# Patient Record
Sex: Female | Born: 1965
Health system: Southern US, Community
[De-identification: ages and names within clinical notes are randomized; demographics above are authoritative.]

---

## 2016-04-20 DIAGNOSIS — I10 Essential (primary) hypertension: Secondary | ICD-10-CM | POA: Diagnosis not present

## 2016-04-20 DIAGNOSIS — D649 Anemia, unspecified: Secondary | ICD-10-CM | POA: Diagnosis not present

## 2016-04-20 DIAGNOSIS — R7309 Other abnormal glucose: Secondary | ICD-10-CM | POA: Diagnosis not present

## 2016-08-25 DIAGNOSIS — R7303 Prediabetes: Secondary | ICD-10-CM | POA: Diagnosis not present

## 2016-08-25 DIAGNOSIS — R7309 Other abnormal glucose: Secondary | ICD-10-CM | POA: Diagnosis not present

## 2016-08-25 DIAGNOSIS — I10 Essential (primary) hypertension: Secondary | ICD-10-CM | POA: Diagnosis not present

## 2016-08-25 DIAGNOSIS — F4321 Adjustment disorder with depressed mood: Secondary | ICD-10-CM | POA: Diagnosis not present

## 2016-12-29 DIAGNOSIS — R7303 Prediabetes: Secondary | ICD-10-CM | POA: Diagnosis not present

## 2016-12-29 DIAGNOSIS — F4321 Adjustment disorder with depressed mood: Secondary | ICD-10-CM | POA: Diagnosis not present

## 2016-12-29 DIAGNOSIS — G4700 Insomnia, unspecified: Secondary | ICD-10-CM | POA: Diagnosis not present

## 2016-12-29 DIAGNOSIS — F5101 Primary insomnia: Secondary | ICD-10-CM | POA: Diagnosis not present

## 2016-12-29 DIAGNOSIS — I10 Essential (primary) hypertension: Secondary | ICD-10-CM | POA: Diagnosis not present

## 2016-12-29 DIAGNOSIS — R7309 Other abnormal glucose: Secondary | ICD-10-CM | POA: Diagnosis not present

## 2016-12-29 DIAGNOSIS — D649 Anemia, unspecified: Secondary | ICD-10-CM | POA: Diagnosis not present

## 2017-01-04 DIAGNOSIS — R799 Abnormal finding of blood chemistry, unspecified: Secondary | ICD-10-CM | POA: Diagnosis not present

## 2017-01-12 DIAGNOSIS — I10 Essential (primary) hypertension: Secondary | ICD-10-CM | POA: Diagnosis not present

## 2017-01-12 DIAGNOSIS — D649 Anemia, unspecified: Secondary | ICD-10-CM | POA: Diagnosis not present

## 2017-01-12 DIAGNOSIS — R7309 Other abnormal glucose: Secondary | ICD-10-CM | POA: Diagnosis not present

## 2017-01-12 DIAGNOSIS — F5101 Primary insomnia: Secondary | ICD-10-CM | POA: Diagnosis not present

## 2017-01-21 DIAGNOSIS — N946 Dysmenorrhea, unspecified: Secondary | ICD-10-CM | POA: Diagnosis not present

## 2017-01-21 DIAGNOSIS — I10 Essential (primary) hypertension: Secondary | ICD-10-CM | POA: Diagnosis not present

## 2017-01-21 DIAGNOSIS — D509 Iron deficiency anemia, unspecified: Secondary | ICD-10-CM | POA: Diagnosis not present

## 2017-01-21 DIAGNOSIS — E559 Vitamin D deficiency, unspecified: Secondary | ICD-10-CM | POA: Diagnosis not present

## 2017-06-27 DIAGNOSIS — Z6831 Body mass index (BMI) 31.0-31.9, adult: Secondary | ICD-10-CM | POA: Diagnosis not present

## 2017-06-27 DIAGNOSIS — I10 Essential (primary) hypertension: Secondary | ICD-10-CM | POA: Diagnosis not present

## 2017-06-27 DIAGNOSIS — R7309 Other abnormal glucose: Secondary | ICD-10-CM | POA: Diagnosis not present

## 2017-06-27 DIAGNOSIS — G4700 Insomnia, unspecified: Secondary | ICD-10-CM | POA: Diagnosis not present

## 2017-07-02 DIAGNOSIS — Z1231 Encounter for screening mammogram for malignant neoplasm of breast: Secondary | ICD-10-CM | POA: Diagnosis not present

## 2017-07-22 DIAGNOSIS — I10 Essential (primary) hypertension: Secondary | ICD-10-CM | POA: Insufficient documentation

## 2017-07-23 ENCOUNTER — Encounter: Payer: Self-pay | Admitting: *Deleted

## 2017-07-23 ENCOUNTER — Ambulatory Visit (INDEPENDENT_AMBULATORY_CARE_PROVIDER_SITE_OTHER): Payer: Federal, State, Local not specified - PPO

## 2017-07-23 DIAGNOSIS — Z1211 Encounter for screening for malignant neoplasm of colon: Secondary | ICD-10-CM | POA: Diagnosis not present

## 2017-07-23 DIAGNOSIS — R635 Abnormal weight gain: Secondary | ICD-10-CM | POA: Diagnosis not present

## 2017-07-23 DIAGNOSIS — R140 Abdominal distension (gaseous): Secondary | ICD-10-CM | POA: Diagnosis not present

## 2017-07-23 DIAGNOSIS — I10 Essential (primary) hypertension: Secondary | ICD-10-CM

## 2017-07-23 DIAGNOSIS — Z800 Family history of malignant neoplasm of digestive organs: Secondary | ICD-10-CM | POA: Diagnosis not present

## 2017-07-23 NOTE — Progress Notes (Signed)
Patient ID: Rebecca Zuniga, female   DOB: 10/08/1966, 51 y.o.   MRN: [id number]  24 hour ambulatory blood pressure monitor applied to patient using standard adult cuff.

## 2017-07-27 DIAGNOSIS — M545 Low back pain: Secondary | ICD-10-CM | POA: Diagnosis not present

## 2017-07-27 DIAGNOSIS — I10 Essential (primary) hypertension: Secondary | ICD-10-CM | POA: Diagnosis not present

## 2017-07-31 DIAGNOSIS — F4310 Post-traumatic stress disorder, unspecified: Secondary | ICD-10-CM | POA: Diagnosis not present

## 2017-08-01 DIAGNOSIS — F4310 Post-traumatic stress disorder, unspecified: Secondary | ICD-10-CM | POA: Diagnosis not present

## 2017-08-21 DIAGNOSIS — F4310 Post-traumatic stress disorder, unspecified: Secondary | ICD-10-CM | POA: Diagnosis not present

## 2017-08-22 DIAGNOSIS — F4310 Post-traumatic stress disorder, unspecified: Secondary | ICD-10-CM | POA: Diagnosis not present

## 2017-08-22 DIAGNOSIS — I10 Essential (primary) hypertension: Secondary | ICD-10-CM | POA: Diagnosis not present

## 2017-09-13 DIAGNOSIS — D122 Benign neoplasm of ascending colon: Secondary | ICD-10-CM | POA: Diagnosis not present

## 2017-09-13 DIAGNOSIS — K621 Rectal polyp: Secondary | ICD-10-CM | POA: Diagnosis not present

## 2017-09-13 DIAGNOSIS — K635 Polyp of colon: Secondary | ICD-10-CM | POA: Diagnosis not present

## 2017-09-13 DIAGNOSIS — D128 Benign neoplasm of rectum: Secondary | ICD-10-CM | POA: Diagnosis not present

## 2017-09-13 DIAGNOSIS — Z1211 Encounter for screening for malignant neoplasm of colon: Secondary | ICD-10-CM | POA: Diagnosis not present

## 2017-09-13 DIAGNOSIS — Z800 Family history of malignant neoplasm of digestive organs: Secondary | ICD-10-CM | POA: Diagnosis not present

## 2017-09-28 DIAGNOSIS — I10 Essential (primary) hypertension: Secondary | ICD-10-CM | POA: Diagnosis not present

## 2017-12-26 ENCOUNTER — Ambulatory Visit (INDEPENDENT_AMBULATORY_CARE_PROVIDER_SITE_OTHER): Payer: Federal, State, Local not specified - PPO

## 2017-12-26 ENCOUNTER — Telehealth: Payer: Self-pay | Admitting: Podiatry

## 2017-12-26 ENCOUNTER — Ambulatory Visit: Payer: Federal, State, Local not specified - PPO | Admitting: Podiatry

## 2017-12-26 ENCOUNTER — Other Ambulatory Visit: Payer: Self-pay | Admitting: Podiatry

## 2017-12-26 ENCOUNTER — Encounter: Payer: Self-pay | Admitting: Podiatry

## 2017-12-26 DIAGNOSIS — M25572 Pain in left ankle and joints of left foot: Secondary | ICD-10-CM

## 2017-12-26 DIAGNOSIS — T148XXA Other injury of unspecified body region, initial encounter: Secondary | ICD-10-CM | POA: Diagnosis not present

## 2017-12-26 DIAGNOSIS — M76822 Posterior tibial tendinitis, left leg: Secondary | ICD-10-CM

## 2017-12-26 DIAGNOSIS — M25571 Pain in right ankle and joints of right foot: Secondary | ICD-10-CM | POA: Diagnosis not present

## 2017-12-26 NOTE — Telephone Encounter (Signed)
I'm returning a call back to I believe Shanda BumpsJessica who called me earlier. I was in to see Dr. Charlsie Merlesegal earlier today. You can call me back at [phone].

## 2017-12-26 NOTE — Progress Notes (Signed)
Subjective:   Patient ID: Rebecca Zuniga, female   DOB: 52 y.o.   MRN: [id number]   HPI Patient presents stating she has had a 5-year history of swelling and pain in her left ankle and is worsened over the last few months.  States that she has trouble being active but now it feels like her foot is not functioning properly and that she is not walking right.  Does not remember specific injury and patient does not smoke and likes to be active and was [place] in [place] with orthotics and immobilization years ago for this condition   Review of Systems  All other systems reviewed and are negative.       Objective:  Physical Exam  Constitutional: She appears well-developed and well-nourished.  Cardiovascular: Intact distal pulses.  Pulmonary/Chest: Effort normal.  Musculoskeletal: Normal range of motion.  Neurological: She is alert.  Skin: Skin is warm.  Nursing note and vitals reviewed.   Neurovascular status intact muscle strength adequate range of motion within normal limits with patient found to have a significant amount of swelling around the posterior tibial tendon as it comes under the medial malleolus with indications of tendon dysfunction when I placed the foot in an inverted position.  Patient does not appear to have normal strength of the tendon at this time and was noted to have good digital perfusion     Assessment:  Inflammatory posterior tibial tendinitis left with very good possibility of a tear of the posterior tibial tendon     Plan:  Given the long-term history of this the amount of pain she is in the amount of swelling and the fact that she has not responded to conservative treatment I recommend an MRI with consideration for surgery for this patient depending on the results.  Patient is scheduled for MRI will be seen back when we get the results  X-rays indicate there is not significant depression of the arch at the current time

## 2017-12-30 ENCOUNTER — Ambulatory Visit
Admission: RE | Admit: 2017-12-30 | Discharge: 2017-12-30 | Disposition: A | Discharge status: Home / Self Care | Payer: Federal, State, Local not specified - PPO | Source: Ambulatory Visit | Attending: Podiatry | Admitting: Podiatry

## 2017-12-30 DIAGNOSIS — M25572 Pain in left ankle and joints of left foot: Secondary | ICD-10-CM | POA: Diagnosis not present

## 2017-12-30 DIAGNOSIS — M76822 Posterior tibial tendinitis, left leg: Secondary | ICD-10-CM

## 2017-12-30 DIAGNOSIS — T148XXA Other injury of unspecified body region, initial encounter: Secondary | ICD-10-CM

## 2017-12-30 DIAGNOSIS — M25571 Pain in right ankle and joints of right foot: Secondary | ICD-10-CM

## 2018-01-01 ENCOUNTER — Encounter: Payer: Self-pay | Admitting: Podiatry

## 2018-01-01 ENCOUNTER — Telehealth: Payer: Self-pay | Admitting: Podiatry

## 2018-01-01 ENCOUNTER — Ambulatory Visit: Payer: Federal, State, Local not specified - PPO | Admitting: Podiatry

## 2018-01-01 DIAGNOSIS — T148XXA Other injury of unspecified body region, initial encounter: Secondary | ICD-10-CM | POA: Diagnosis not present

## 2018-01-01 NOTE — Telephone Encounter (Signed)
Called pt per Dr. Gabriel RungWagoner's request to get her scheduled to come in for her MRI results but I saw where she was seen today so I told her to disregard the message and to call our office with any questions or concerns at [phone]. After hanging up from leaving the voicemail I spoke with Dr. Ardelle AntonWagoner and he stated to get her scheduled to come in for a surgical consult with him. So when she calls back please get her scheduled to see Dr. Ardelle AntonWagoner for a surgery consult.

## 2018-01-02 NOTE — Progress Notes (Signed)
Subjective:   Patient ID: Rebecca Zuniga, female   DOB: 52 y.o.   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 Shella SpearingWaggener.  Due to the long-standing nature of this and the failure to respond to conservative treatment over the last few years surgical intervention is recommended.  She does have quite a bit of edema at this point will get a try to reduce this and I gave her instructions on compression therapy ice therapy and did dispense air fracture walker that she will need anyways postoperatively to try to reduce the stress on the tendon prior to surgery.  I am referring her to Dr. Shella SpearingWaggener for this condition and he will see her back in the office to discuss correction of the tear of the posterior tibial tendon and patient is excited to have this corrected

## 2018-01-07 ENCOUNTER — Ambulatory Visit: Payer: Federal, State, Local not specified - PPO | Admitting: Podiatry

## 2018-01-07 ENCOUNTER — Encounter: Payer: Self-pay | Admitting: Podiatry

## 2018-01-07 DIAGNOSIS — T148XXA Other injury of unspecified body region, initial encounter: Secondary | ICD-10-CM | POA: Diagnosis not present

## 2018-01-07 NOTE — Patient Instructions (Signed)

## 2018-01-08 DIAGNOSIS — T148XXA Other injury of unspecified body region, initial encounter: Secondary | ICD-10-CM | POA: Insufficient documentation

## 2018-01-08 NOTE — Progress Notes (Signed)
Subjective: Rebecca Zuniga presents the office today for follow-up evaluation of the MRI of the left ankle, posterior tibial tendon tear.  She has had pain to the inside aspect of her ankle since 2013.  At that time she was in the boot for about 1 year that she did not have noticed significant improvement and so she went back to her normal activities and did not have follow-up although she is continued to have pain.  She states the pain along the area has gotten worse and that is when she came to the office.  An MRI was performed which revealed a posterior tibial tendon longitudinal tear.  She was seen Dr. Charlsie Merlesegal for this we discussed surgery and she presents today for surgical consultation.  This is been a chronic issue for 5 years and she is attempted long-term immobilization. Denies any systemic complaints such as fevers, chills, nausea, vomiting. No acute changes since last appointment, and no other complaints at this time.   Objective: AAO x3, NAD DP/PT pulses palpable bilaterally, CRT less than 3 seconds There is tenderness directly along the posterior tibial tendon just posterior and inferior to the medial malleolus but it does not extend all the way down to the navicular tuberosity or discomfort.  There is mild edema to the area but there is no erythema or increase in warmth.  She is unable to do a single heel rise to the leg.  Is no pain to the ankle joint there is no other area of tenderness identified.  Achilles tendon appears to be intact.  No other tenderness. No open lesions or pre-ulcerative lesions.  No pain with calf compression, swelling, warmth, erythema  Assessment: Posterior tibial tendon dysfunction, posterior tibial tendon tear  Plan: -All treatment options discussed with the patient including all alternatives, risks, complications.  -MRI was reviewed with the patient again today.  It revealed a moderate tendinosis at the posterior tibial tendon with short segment longitudinal split  tear and moderate tenosynovitis. -Given the longevity of her symptoms we discussed surgical intervention including posterior tibial tendon repair.  We discussed the surgery as well as the postoperative course.  She would like to go ahead and proceed with surgery.  This is not a guarantee resolution of symptoms.  She understands this and she wishes to proceed. -The incision placement as well as the postoperative course was discussed with the patient. I discussed risks of the surgery which include, but not limited to, infection, bleeding, pain, swelling, need for further surgery, delayed or nonhealing, painful or ugly scar, numbness or sensation changes, over/under correction, recurrence, transfer lesions, further deformity, hardware failure, DVT/PE, loss of toe/foot. Patient understands these risks and wishes to proceed with surgery. The surgical consent was reviewed with the patient all 3 pages were signed. No promises or guarantees were given to the outcome of the procedure. All questions were answered to the best of my ability. Before the surgery the patient was encouraged to call the office if there is any further questions. The [facility] on an outpatient basis. -She does not smoke and she denies any history of blood clots or bleeding disorders.  She has no history of sickle cell or sickle cell trait. -Cam boot was dispensed for postoperative use.  We will do a longer cam boot postop. -Patient encouraged to call the office with any questions, concerns, change in symptoms.   Vivi BarrackMatthew R Leslieanne Cobarrubias DPM

## 2018-01-14 ENCOUNTER — Telehealth: Payer: Self-pay | Admitting: Podiatry

## 2018-01-14 DIAGNOSIS — R7309 Other abnormal glucose: Secondary | ICD-10-CM | POA: Diagnosis not present

## 2018-01-14 DIAGNOSIS — T148XXA Other injury of unspecified body region, initial encounter: Secondary | ICD-10-CM

## 2018-01-14 DIAGNOSIS — M12512 Traumatic arthropathy, left shoulder: Secondary | ICD-10-CM | POA: Diagnosis not present

## 2018-01-14 DIAGNOSIS — I10 Essential (primary) hypertension: Secondary | ICD-10-CM | POA: Diagnosis not present

## 2018-01-14 NOTE — Telephone Encounter (Signed)
I sent a home health care request to [facility] per Dr. Ardelle AntonWagoner.  The instructions were for them to do light physical therapy to work on her core and also postop checks for safety.

## 2018-01-14 NOTE — Telephone Encounter (Signed)
Dr. Ardelle AntonWagoner do you want this patient to have home health care?  If so, what are the instructions?

## 2018-01-14 NOTE — Addendum Note (Signed)
Addended by: Enedina FinnerMEADOWS, Arelly Whittenberg J on: 01/14/2018 05:57 PM   Modules accepted: Orders

## 2018-01-14 NOTE — Telephone Encounter (Signed)
I did not talk to her about home health. However we could get it set up to have them come out. There will not be special instructions the first few weeks but they can do some light physical therapy to work on her core and also postop checks for safety.

## 2018-01-14 NOTE — Telephone Encounter (Signed)
I called and informed the patient that we placed orders for home health care.

## 2018-01-14 NOTE — Telephone Encounter (Signed)
Pt states she checked her insurance and they will pay for in-home services after surgery. I told pt is would send the message surgery coordinator to assist.

## 2018-01-14 NOTE — Telephone Encounter (Signed)
I'm a pt of Dr. Gabriel RungWagoner's and I need to speak to the nurse. My call back number is [phone].

## 2018-01-15 ENCOUNTER — Telehealth: Payer: Self-pay | Admitting: Podiatry

## 2018-01-15 ENCOUNTER — Encounter: Payer: Self-pay | Admitting: Podiatry

## 2018-01-15 ENCOUNTER — Telehealth: Payer: Self-pay | Admitting: *Deleted

## 2018-01-15 DIAGNOSIS — I10 Essential (primary) hypertension: Secondary | ICD-10-CM | POA: Diagnosis not present

## 2018-01-15 DIAGNOSIS — M25572 Pain in left ankle and joints of left foot: Secondary | ICD-10-CM | POA: Diagnosis not present

## 2018-01-15 DIAGNOSIS — M66372 Spontaneous rupture of flexor tendons, left ankle and foot: Secondary | ICD-10-CM | POA: Diagnosis not present

## 2018-01-15 DIAGNOSIS — M76822 Posterior tibial tendinitis, left leg: Secondary | ICD-10-CM | POA: Diagnosis not present

## 2018-01-15 DIAGNOSIS — T148XXD Other injury of unspecified body region, subsequent encounter: Secondary | ICD-10-CM | POA: Diagnosis not present

## 2018-01-15 MED ORDER — PROMETHAZINE HCL 25 MG PO TABS: 25.0000 mg | ORAL_TABLET | Freq: Three times a day (TID) | ORAL | 0 refills | Status: AC | PRN

## 2018-01-15 MED ORDER — OXYCODONE-ACETAMINOPHEN 5-325 MG PO TABS: 1.0000 | ORAL_TABLET | ORAL | 0 refills | Status: DC | PRN

## 2018-01-15 MED ORDER — CEPHALEXIN 500 MG PO CAPS: 500.0000 mg | ORAL_CAPSULE | Freq: Three times a day (TID) | ORAL | 0 refills | Status: AC

## 2018-01-15 MED ORDER — CEPHALEXIN 500 MG PO CAPS: 500.0000 mg | ORAL_CAPSULE | Freq: Three times a day (TID) | ORAL | 0 refills | Status: DC

## 2018-01-15 MED ORDER — OXYCODONE-ACETAMINOPHEN 5-325 MG PO TABS: 1.0000 | ORAL_TABLET | ORAL | 0 refills | Status: AC | PRN

## 2018-01-15 MED ORDER — PROMETHAZINE HCL 25 MG PO TABS: 25.0000 mg | ORAL_TABLET | Freq: Three times a day (TID) | ORAL | 0 refills | Status: DC | PRN

## 2018-01-15 NOTE — Telephone Encounter (Signed)
I informed pt, Rebecca Zuniga - [facility] states she did not know what the problem was, the medication is ready to be picked up. Pt states that is not true, she went to get her blood pressure medicine and they said the same damn thing and it wasn't ready. Pt states that was all right she would send her friend to get it.

## 2018-01-15 NOTE — Telephone Encounter (Signed)
My name is Rebecca Zuniga. My date of birth is [birth year]. My phone number is [phone].

## 2018-01-15 NOTE — Telephone Encounter (Signed)
Pt states the rx that was handwritten was not the same as the one ordered by Dr. Ardelle AntonWagoner, but was not at the pharmacy. I told pt all the rx were the same as ordered by Dr. Ardelle AntonWagoner, only printed so she could take to the pharmacy, but I would call the [facility] to see if rxs could be filled.

## 2018-01-15 NOTE — Telephone Encounter (Signed)
Pt's caregiver states pt's post op medications are not at the [facility]. I reprinted the orders for percocet, keflex and promethazine, since they did not show as being received at [facility], and S. [place] - Psychologist, sport and exerciseAssistant Office Manager gave to caregiver.

## 2018-01-15 NOTE — Progress Notes (Signed)
Pre-operative Note  Patient presents to the [facility] today for surgical intervention of the LEFT ankle for posterior tibial tendon tendon repair. The surgical consent was reviewed with the patient and we discussed the procedure as well as the postoperative course. I again discussed all alternatives, risks, complications. I answered all of their questions to the best of my ability and they wish to proceed with surgery. No promises or guarantees were given as to the outcome of the surgery.   The surgical consent was signed.   Patient is NPO since midnight.  The patient does not have have a history of blood clots or bleeding disorders.   She has her friend in the waiting room but she does NOT want me to talk to them after surgery.   No further questions.   Ovid CurdMatthew Wagoner, DPM [facility]

## 2018-01-15 NOTE — Telephone Encounter (Signed)
I InBasket a message to Shirlean SchleinMary Ozimek - [facility] to see if pt had be accepted in their in-home PT program.

## 2018-01-15 NOTE — Telephone Encounter (Signed)
This is Lorenza Evangelistvelyn Medaglia calling for the nurse again. My number is [phone].

## 2018-01-15 NOTE — Telephone Encounter (Signed)
Rebecca Zuniga - [facility] states she does not know what the problem is, the percocet and other medications are ready to be picked up.

## 2018-01-15 NOTE — Telephone Encounter (Signed)
Reviewed communications between Rebecca Zuniga. [facility] -Surgery Coordinator and Dr. Ardelle AntonWagoner, Dr. Ardelle AntonWagoner ordered [facility] to perform light PT to strengthen core and for post op safety.

## 2018-01-15 NOTE — Telephone Encounter (Signed)
I'm calling for Temple-InlandEvelyn Zuniga. She had surgery today and just calling to see if they will be sending somebody out tomorrow to come over to her house. She has a nurse or something like that. You can reach her at [phone]. Thank you.

## 2018-01-16 ENCOUNTER — Telehealth: Payer: Self-pay | Admitting: *Deleted

## 2018-01-16 NOTE — Telephone Encounter (Signed)
Called patient and stated that I was calling to check on her after the surgery yesterday and patient stated that it was still numb but there was no pain and was given crutches and patient felt like she was rushed out of the surgery center and was not explained how to use the crutches and wants to talk about getting a scooter and patient stated there was no fever,chills,naseua and stated to patient that her appointment was at 11:30 am with Dr Logan BoresEvans and to call the office if any concerns. Misty StanleyLisa

## 2018-01-17 ENCOUNTER — Telehealth: Payer: Self-pay | Admitting: *Deleted

## 2018-01-17 DIAGNOSIS — Z9889 Other specified postprocedural states: Secondary | ICD-10-CM

## 2018-01-17 NOTE — Telephone Encounter (Signed)
[facility] just showed up and didn't discuss the services they provide, wanted to cost and said they didn't get the orders until yesterday late. I told pt the orders were sent by the surgical coordinator the day before her surgery, and Dr. Ardelle AntonWagoner had ordered PT Core training to assist with balance, and she may want to make a list of questions to ask Dr. Ardelle AntonWagoner at her PO visit. Pt states she can't get around with the crutches and wants a scooter. I told pt I would send an order for the scooter immediately.

## 2018-01-17 NOTE — Telephone Encounter (Signed)
Faxed orders for Knee Scooter to [facility].

## 2018-01-17 NOTE — Telephone Encounter (Signed)
Pt called to talk about Home Health Care.

## 2018-01-17 NOTE — Telephone Encounter (Signed)
[place] - [facility] states pt would like to hold off on HHC until after sees Dr. Ardelle AntonWagoner.

## 2018-01-20 ENCOUNTER — Encounter: Payer: Self-pay | Admitting: Podiatry

## 2018-01-20 ENCOUNTER — Ambulatory Visit (INDEPENDENT_AMBULATORY_CARE_PROVIDER_SITE_OTHER): Payer: Federal, State, Local not specified - PPO | Admitting: Podiatry

## 2018-01-20 DIAGNOSIS — M76822 Posterior tibial tendinitis, left leg: Secondary | ICD-10-CM

## 2018-01-20 DIAGNOSIS — Z9889 Other specified postprocedural states: Secondary | ICD-10-CM

## 2018-01-20 DIAGNOSIS — M25571 Pain in right ankle and joints of right foot: Secondary | ICD-10-CM | POA: Diagnosis not present

## 2018-01-20 NOTE — Progress Notes (Signed)
DOS 01/15/18 Lt ankle repair of tibial tendon

## 2018-01-21 ENCOUNTER — Telehealth: Payer: Self-pay | Admitting: Podiatry

## 2018-01-21 NOTE — Telephone Encounter (Signed)
I was there yesterday. If someone could call me back at [phone]. Thank you.

## 2018-01-21 NOTE — Telephone Encounter (Signed)
Pt states she brought in FMLA papers and the front page was to be completed by our office, so she would like to send another copy of the front page. I gave pt the [facility] email address for [facility].

## 2018-01-22 NOTE — Progress Notes (Signed)
   Subjective:  Patient presents today status post PT tendon repair left. DOS: 01/15/18. She states she is doing well. She is requesting an order for a knee scooter. She states using the crutches causes pain in her shoulders. Patient is here for further evaluation and treatment.    No past medical history on file.    Objective/Physical Exam Neurovascular status intact.  Skin incisions appear to be well coapted with sutures and staples intact. No sign of infectious process noted. No dehiscence. No active bleeding noted. Moderate edema noted to the surgical extremity.  Assessment: 1. s/p PT tendon repair left. DOS: 01/15/18   Plan of Care:  1. Patient was evaluated.  2. Dressing changed. 3. Continue nonweightbearing in CAM boot. 4. Order placed for knee scooter today. 5. Return to clinic in one week with Dr. Ardelle AntonWagoner.     Felecia ShellingBrent M. Marissa Weaver, DPM [facility]  Dr. Felecia ShellingBrent M. Aaryav Hopfensperger, DPM    [address]                                        [place], [place] [address]                Office [phone]  Fax [phone]

## 2018-01-27 ENCOUNTER — Encounter: Payer: Self-pay | Admitting: Podiatry

## 2018-01-27 ENCOUNTER — Ambulatory Visit (INDEPENDENT_AMBULATORY_CARE_PROVIDER_SITE_OTHER): Payer: Federal, State, Local not specified - PPO | Admitting: Podiatry

## 2018-01-27 DIAGNOSIS — M76822 Posterior tibial tendinitis, left leg: Secondary | ICD-10-CM

## 2018-01-27 DIAGNOSIS — Z9889 Other specified postprocedural states: Secondary | ICD-10-CM

## 2018-01-27 MED ORDER — CYCLOBENZAPRINE HCL 10 MG PO TABS: 10.0000 mg | ORAL_TABLET | Freq: Three times a day (TID) | ORAL | 0 refills | Status: AC | PRN

## 2018-02-02 DIAGNOSIS — M76822 Posterior tibial tendinitis, left leg: Secondary | ICD-10-CM | POA: Insufficient documentation

## 2018-02-02 NOTE — Progress Notes (Signed)
Subjective: Rebecca Zuniga is a 52 y.o. is seen today in office s/p left posterior tibial tendon repair preformed on 01/15/2018.  She states that she is very pleased that she is not having any pain in the ankle.  She has been nonweightbearing and using the cam boot.  She occasionally describes some muscle spasms.  She is not putting any pain medicine but she takes ibuprofen which does not help the muscle spasms.  She is been using a knee scooter.  She has no other concerns and she denies any recent injury. Denies any systemic complaints such as fevers, chills, nausea, vomiting. No calf pain, chest pain, shortness of breath.   Objective: General: No acute distress, AAOx3  DP/PT pulses palpable 2/4, CRT < 3 sec to all digits.  Protective sensation intact. Motor function intact.  LEFT ankle : Incision is well coapted without any evidence of dehiscence and suture/stapless are intact. There is no surrounding erythema, ascending cellulitis, fluctuance, crepitus, malodor, drainage/purulence. There is minimal edema around the surgical site. There is no pain along the surgical site.  No other areas of tenderness to bilateral lower extremities.  No other open lesions or pre-ulcerative lesions.  No pain with calf compression, swelling, warmth, erythema.   Assessment and Plan:  Status post left ankle PT repair, doing well with no complications   -Treatment options discussed including all alternatives, risks, and complications -Upon evaluation there is mild motion across the incisions therefore left the sutures and staples intact.  Antibiotic ointment was applied followed by a bandage.  Keep the dressing clean, dry, intact. -Continue nonweightbearing in cam boot at all times. -Ice/elevation -Pain medication as needed-  She has not been taking any narcotic pain medicine but I did prescribe Flexeril to help with muscle spasms.  She said it is as needed. -Monitor for any clinical signs or symptoms of infection  and DVT/PE and directed to call the office immediately should any occur or go to the ER. -Follow-up in 1 week for suture removal or sooner if any problems arise. In the meantime, encouraged to call the office with any questions, concerns, change in symptoms.   Ovid CurdMatthew Sheyli Horwitz, DPM

## 2018-02-04 ENCOUNTER — Ambulatory Visit (INDEPENDENT_AMBULATORY_CARE_PROVIDER_SITE_OTHER): Payer: Federal, State, Local not specified - PPO | Admitting: Podiatry

## 2018-02-04 DIAGNOSIS — Z9889 Other specified postprocedural states: Secondary | ICD-10-CM

## 2018-02-04 DIAGNOSIS — T148XXA Other injury of unspecified body region, initial encounter: Secondary | ICD-10-CM

## 2018-02-05 NOTE — Progress Notes (Signed)
Subjective: Rebecca Zuniga is a 52 y.o. is seen today in office s/p left posterior tibial tendon repair preformed on 01/15/2018.  She states that she is doing well she is not having any pain.  She is remained nonweightbearing in the cam boot.  She presents today for suture removal.  She again states that she is very happy that she is not been having any discomfort and she is doing very well.  She denies any recent injury or trauma she has no other concerns today. Denies any systemic complaints such as fevers, chills, nausea, vomiting. No calf pain, chest pain, shortness of breath.   Objective: General: No acute distress, AAOx3  DP/PT pulses palpable 2/4, CRT < 3 sec to all digits.  Protective sensation intact. Motor function intact.  LEFT ankle: Incision is well coapted without any evidence of dehiscence and suture/stapless are intact. There is no surrounding erythema, ascending cellulitis, fluctuance, crepitus, malodor, drainage/purulence. There is trace edema around the surgical site. There is no pain along the surgical site.  No other areas of tenderness to bilateral lower extremities.  No other open lesions or pre-ulcerative lesions.  No pain with calf compression, swelling, warmth, erythema.   Assessment and Plan:  Status post left ankle PT repair, doing well with no complications   -Treatment options discussed including all alternatives, risks, and complications -Sutures were removed by local staples intact to ensure healing of the incision is doing well.  There is no drainage or pus or any signs of infection.  She can start to shower on Thursday and I want her to keep antibiotic ointment and a bandage on the area and this was applied today as well. -Continue cam boot and nonweightbearing -She can start some gentle range of motion exercises to the ankle. -Continue ice and elevation -Monitor for any clinical signs or symptoms of infection and DVT/PE and directed to call the office  immediately should any occur or go to the ER. -Follow-up in in 10 days for staple removal or sooner if any problems arise. In the meantime, encouraged to call the office with any questions, concerns, change in symptoms.   Ovid CurdMatthew Wagoner, DPM

## 2018-02-14 ENCOUNTER — Ambulatory Visit (INDEPENDENT_AMBULATORY_CARE_PROVIDER_SITE_OTHER): Payer: Federal, State, Local not specified - PPO | Admitting: Podiatry

## 2018-02-14 DIAGNOSIS — Z9889 Other specified postprocedural states: Secondary | ICD-10-CM

## 2018-02-14 DIAGNOSIS — T148XXA Other injury of unspecified body region, initial encounter: Secondary | ICD-10-CM

## 2018-02-17 DIAGNOSIS — M25571 Pain in right ankle and joints of right foot: Secondary | ICD-10-CM | POA: Diagnosis not present

## 2018-02-17 DIAGNOSIS — M25672 Stiffness of left ankle, not elsewhere classified: Secondary | ICD-10-CM | POA: Diagnosis not present

## 2018-02-17 DIAGNOSIS — M25472 Effusion, left ankle: Secondary | ICD-10-CM | POA: Diagnosis not present

## 2018-02-17 DIAGNOSIS — R269 Unspecified abnormalities of gait and mobility: Secondary | ICD-10-CM | POA: Diagnosis not present

## 2018-02-17 DIAGNOSIS — M6281 Muscle weakness (generalized): Secondary | ICD-10-CM | POA: Diagnosis not present

## 2018-02-19 NOTE — Progress Notes (Signed)
Subjective: Rebecca Zuniga is a 52 y.o. is seen today in office s/p left posterior tibial tendon repair preformed on 01/15/2018. She states that she is doing well.  She had a couple days of some burning pain on the incision but this resolved.  Otherwise she is having no pain she has never taken any pain medicine. She is been nonweightbearing in a cam boot.  She has no new concerns today.  No recent injury or trauma. Denies any systemic complaints such as fevers, chills, nausea, vomiting. No calf pain, chest pain, shortness of breath.   Objective: General: No acute distress, AAOx3  DP/PT pulses palpable 2/4, CRT < 3 sec to all digits.  Protective sensation intact. Motor function intact.  LEFT ankle: Incision is well coapted without any evidence of dehiscence and suture/stapless are intact. There is no surrounding erythema, ascending cellulitis, fluctuance, crepitus, malodor, drainage/purulence. There is overall appears to be doing well.  Edema around the surgical site. There is no pain along the surgical site.  No other areas of tenderness to bilateral lower extremities.   No other open lesions or pre-ulcerative lesions.  No pain with calf compression, swelling, warmth, erythema.   Assessment and Plan:  Status post left ankle PT repair, doing well with no complications   -Treatment options discussed including all alternatives, risks, and complications -Staples removed today without complication.  After removal incision remains well coapted.  Steri-Strips were applied for reinforcement followed by antibiotic ointment and a bandage.  She can start to shower tomorrow if the area wet and put Neosporin and a bandage.  Also continuing cam boot and nonweightbearing but she can fully start to transition to partial weightbearing.  Continue ice and elevation.  I will see her back in 2 weeks.  At that point we will likely start physical therapy.  Call any questions or concerns meantime.  She agrees with this  plan has no further questions today.  Vivi BarrackMatthew R Lyndi Holbein DPM

## 2018-02-27 ENCOUNTER — Ambulatory Visit (INDEPENDENT_AMBULATORY_CARE_PROVIDER_SITE_OTHER): Payer: Federal, State, Local not specified - PPO | Admitting: Podiatry

## 2018-02-27 ENCOUNTER — Encounter: Payer: Self-pay | Admitting: Podiatry

## 2018-02-27 DIAGNOSIS — T148XXA Other injury of unspecified body region, initial encounter: Secondary | ICD-10-CM

## 2018-02-27 DIAGNOSIS — Z9889 Other specified postprocedural states: Secondary | ICD-10-CM

## 2018-03-02 NOTE — Progress Notes (Signed)
Subjective: Rebecca Zuniga is a 52 y.o. is seen today in office s/p left posterior tibial tendon repair preformed on 01/15/2018.  She states that she is doing well she is not having any pain to the area.  She is been in the cam boot.  She has been doing some gentle range of motion exercises on her own.  No recent injury since I last saw her and she has no concerns today.  She states that she feels great. Denies any systemic complaints such as fevers, chills, nausea, vomiting. No calf pain, chest pain, shortness of breath.   Objective: General: No acute distress, AAOx3  DP/PT pulses palpable 2/4, CRT < 3 sec to all digits.  Protective sensation intact. Motor function intact.  LEFT ankle: Incision is well coapted without any evidence of dehiscence and a scar is formed. There is no surrounding erythema, ascending cellulitis, fluctuance, crepitus, malodor, drainage/purulence. There is overall appears to be doing well.  Minimal around the surgical site. There is no pain along the surgical site.  Mild discomfort of the posterior heel on the insertion of the Achilles tendon into the calcaneus.  Thompson test is negative.  No edema, erythema, increase in warmth. No other areas of tenderness to bilateral lower extremities.   No other open lesions or pre-ulcerative lesions.  No pain with calf compression, swelling, warmth, erythema.   Assessment and Plan:  Status post left ankle PT repair, doing well with no complications-heel pain likely result of immobilization.  -Treatment options discussed including all alternatives, risks, and complications -At this point I want her to start to transition to partial weightbearing and full weightbearing in the boot.  And start physical therapy to work on this.  A prescription was given for [facility] to start therapy and rehab.  On her continue ice and elevation.  Gradual increased activity level. -I will follow-up with her in the next couple of weeks  during physical therapy or sooner if any issues are to arise.  Call any questions or concerns meantime.  Vivi BarrackMatthew R Wagoner DPM

## 2018-03-03 NOTE — Addendum Note (Signed)
Addended by: Alphia Kava'CONNELL, VALERY D on: 03/03/2018 09:55 AM   Modules accepted: Orders

## 2018-03-07 DIAGNOSIS — M25472 Effusion, left ankle: Secondary | ICD-10-CM | POA: Diagnosis not present

## 2018-03-07 DIAGNOSIS — R269 Unspecified abnormalities of gait and mobility: Secondary | ICD-10-CM | POA: Diagnosis not present

## 2018-03-07 DIAGNOSIS — M6281 Muscle weakness (generalized): Secondary | ICD-10-CM | POA: Diagnosis not present

## 2018-03-07 DIAGNOSIS — M25672 Stiffness of left ankle, not elsewhere classified: Secondary | ICD-10-CM | POA: Diagnosis not present

## 2018-03-10 DIAGNOSIS — M6281 Muscle weakness (generalized): Secondary | ICD-10-CM | POA: Diagnosis not present

## 2018-03-10 DIAGNOSIS — M25672 Stiffness of left ankle, not elsewhere classified: Secondary | ICD-10-CM | POA: Diagnosis not present

## 2018-03-10 DIAGNOSIS — R269 Unspecified abnormalities of gait and mobility: Secondary | ICD-10-CM | POA: Diagnosis not present

## 2018-03-10 DIAGNOSIS — M25472 Effusion, left ankle: Secondary | ICD-10-CM | POA: Diagnosis not present

## 2018-03-13 ENCOUNTER — Encounter: Payer: Self-pay | Admitting: Podiatry

## 2018-03-13 ENCOUNTER — Ambulatory Visit (INDEPENDENT_AMBULATORY_CARE_PROVIDER_SITE_OTHER): Payer: Federal, State, Local not specified - PPO | Admitting: Podiatry

## 2018-03-13 DIAGNOSIS — T148XXA Other injury of unspecified body region, initial encounter: Secondary | ICD-10-CM

## 2018-03-13 DIAGNOSIS — Z9889 Other specified postprocedural states: Secondary | ICD-10-CM

## 2018-03-13 NOTE — Progress Notes (Signed)
Subjective: Rebecca Zuniga is a 52 y.o. is seen today in office s/p left posterior tibial tendon repair preformed on 01/15/2018.  She states that she is doing great and she is having no pain.  She states that physical therapy is going well.  She is still wearing the boot she has crutches intermittently but she is not having any issues.  She is very happy with the outcome of the surgery so far.  She has no recent injury or trauma she has no other concerns today. Denies any systemic complaints such as fevers, chills, nausea, vomiting. No calf pain, chest pain, shortness of breath.   Objective: General: No acute distress, AAOx3  DP/PT pulses palpable 2/4, CRT < 3 sec to all digits.  Protective sensation intact. Motor function intact.  LEFT ankle: Incision is well coapted without any evidence of dehiscence and a scar is formed.  There is no tenderness palpation to the surgical site.  There is no pain with ankle or subtalar joint range of motion or range of motion of the posterior tibial tendon.  MMT 5/5.  There is very minimal edema to the surgical site and there is no erythema or increase in warmth.  There is no other area of tenderness identified at this time.  Heel pain resolved upon palpation today. No other open lesions or pre-ulcerative lesions.  No pain with calf compression, swelling, warmth, erythema.   Assessment and Plan:  Status post left ankle PT repair, doing well with no complications  -Treatment options discussed including all alternatives, risks, and complications -At this point start to transition to weightbearing as tolerated in the cam boot.  Wants to do this comfortably to continue with physical therapy we can start to transition back to regular shoe.  I want her to continue with elevation and ice.  Also she can return to work on Monday note was provided today for her to wear the surgical boot or a tennis shoe.  I will see her back in 3-4 weeks or sooner if needed.  Call any questions  or concerns.  So far she is doing very well has no concerns.  Vivi BarrackMatthew R Wagoner DPM

## 2018-03-14 DIAGNOSIS — M25672 Stiffness of left ankle, not elsewhere classified: Secondary | ICD-10-CM | POA: Diagnosis not present

## 2018-03-14 DIAGNOSIS — M25472 Effusion, left ankle: Secondary | ICD-10-CM | POA: Diagnosis not present

## 2018-03-14 DIAGNOSIS — R269 Unspecified abnormalities of gait and mobility: Secondary | ICD-10-CM | POA: Diagnosis not present

## 2018-03-14 DIAGNOSIS — M6281 Muscle weakness (generalized): Secondary | ICD-10-CM | POA: Diagnosis not present

## 2018-03-17 DIAGNOSIS — M6281 Muscle weakness (generalized): Secondary | ICD-10-CM | POA: Diagnosis not present

## 2018-03-17 DIAGNOSIS — M25472 Effusion, left ankle: Secondary | ICD-10-CM | POA: Diagnosis not present

## 2018-03-17 DIAGNOSIS — M25672 Stiffness of left ankle, not elsewhere classified: Secondary | ICD-10-CM | POA: Diagnosis not present

## 2018-03-17 DIAGNOSIS — R269 Unspecified abnormalities of gait and mobility: Secondary | ICD-10-CM | POA: Diagnosis not present

## 2018-03-19 DIAGNOSIS — M6281 Muscle weakness (generalized): Secondary | ICD-10-CM | POA: Diagnosis not present

## 2018-03-19 DIAGNOSIS — M25472 Effusion, left ankle: Secondary | ICD-10-CM | POA: Diagnosis not present

## 2018-03-19 DIAGNOSIS — R269 Unspecified abnormalities of gait and mobility: Secondary | ICD-10-CM | POA: Diagnosis not present

## 2018-03-19 DIAGNOSIS — M25672 Stiffness of left ankle, not elsewhere classified: Secondary | ICD-10-CM | POA: Diagnosis not present

## 2018-03-20 DIAGNOSIS — M6281 Muscle weakness (generalized): Secondary | ICD-10-CM | POA: Diagnosis not present

## 2018-03-20 DIAGNOSIS — M25472 Effusion, left ankle: Secondary | ICD-10-CM | POA: Diagnosis not present

## 2018-03-20 DIAGNOSIS — R269 Unspecified abnormalities of gait and mobility: Secondary | ICD-10-CM | POA: Diagnosis not present

## 2018-03-20 DIAGNOSIS — M25571 Pain in right ankle and joints of right foot: Secondary | ICD-10-CM | POA: Diagnosis not present

## 2018-03-20 DIAGNOSIS — M25672 Stiffness of left ankle, not elsewhere classified: Secondary | ICD-10-CM | POA: Diagnosis not present

## 2018-03-31 DIAGNOSIS — M25672 Stiffness of left ankle, not elsewhere classified: Secondary | ICD-10-CM | POA: Diagnosis not present

## 2018-03-31 DIAGNOSIS — M6281 Muscle weakness (generalized): Secondary | ICD-10-CM | POA: Diagnosis not present

## 2018-03-31 DIAGNOSIS — M25472 Effusion, left ankle: Secondary | ICD-10-CM | POA: Diagnosis not present

## 2018-03-31 DIAGNOSIS — R269 Unspecified abnormalities of gait and mobility: Secondary | ICD-10-CM | POA: Diagnosis not present

## 2018-04-02 DIAGNOSIS — M6281 Muscle weakness (generalized): Secondary | ICD-10-CM | POA: Diagnosis not present

## 2018-04-02 DIAGNOSIS — M25672 Stiffness of left ankle, not elsewhere classified: Secondary | ICD-10-CM | POA: Diagnosis not present

## 2018-04-02 DIAGNOSIS — M25472 Effusion, left ankle: Secondary | ICD-10-CM | POA: Diagnosis not present

## 2018-04-02 DIAGNOSIS — R269 Unspecified abnormalities of gait and mobility: Secondary | ICD-10-CM | POA: Diagnosis not present

## 2018-04-03 ENCOUNTER — Encounter: Payer: Self-pay | Admitting: Podiatry

## 2018-04-03 ENCOUNTER — Ambulatory Visit (INDEPENDENT_AMBULATORY_CARE_PROVIDER_SITE_OTHER): Payer: Federal, State, Local not specified - PPO | Admitting: Podiatry

## 2018-04-03 DIAGNOSIS — T148XXA Other injury of unspecified body region, initial encounter: Secondary | ICD-10-CM

## 2018-04-03 DIAGNOSIS — Z9889 Other specified postprocedural states: Secondary | ICD-10-CM

## 2018-04-07 DIAGNOSIS — M25672 Stiffness of left ankle, not elsewhere classified: Secondary | ICD-10-CM | POA: Diagnosis not present

## 2018-04-07 DIAGNOSIS — R269 Unspecified abnormalities of gait and mobility: Secondary | ICD-10-CM | POA: Diagnosis not present

## 2018-04-07 DIAGNOSIS — M6281 Muscle weakness (generalized): Secondary | ICD-10-CM | POA: Diagnosis not present

## 2018-04-07 DIAGNOSIS — M25472 Effusion, left ankle: Secondary | ICD-10-CM | POA: Diagnosis not present

## 2018-04-07 NOTE — Progress Notes (Addendum)
Subjective: Rebecca Zuniga is a 52 y.o. is seen today in office s/p left posterior tibial tendon repair preformed on 01/15/2018.  She states that she is doing well.  She gets an occasional sharp pain along the incision but this is very intermittent.  She is continue to walk in the cam boot.  She is continue with physical therapy.  She has some occasional swelling but overall she is doing well.  She has no new concerns.  No recent injury or trauma. Denies any systemic complaints such as fevers, chills, nausea, vomiting. No calf pain, chest pain, shortness of breath.   Objective: General: No acute distress, AAOx3  DP/PT pulses palpable 2/4, CRT < 3 sec to all digits.  Protective sensation intact. Motor function intact.  LEFT ankle: Incision is well coapted without any evidence of dehiscence and a scar is formed.  Minimal swelling along the surgical site.  There is no surrounding erythema, increase in warmth.  There is no significant discomfort upon palpation of the surgical site.  There is no clinical signs of infection she appears to be healing well.  MMT 5/5.  Range of motion intact.  No other areas of tenderness identified at this time. No other open lesions or pre-ulcerative lesions.  No pain with calf compression, swelling, warmth, erythema.   Assessment and Plan:  Status post left ankle PT repair, doing well with no complications  -Treatment options discussed including all alternatives, risks, and complications -We will continue with physical therapy which she will can start to transition to regular shoe as tolerable.  Continue ice and elevate and gradually increase activity level.  She is doing well from a surgical standpoint we will continue with rehab.  I will see her back in the next 3-4 weeks for follow-up or sooner if any issues are to arise.  Help with that plan should be walking in a sneaker.  I did dispense a Tri-Lock ankle brace for her to wear as she transitioned into a sneaker at  first.  However as she continues with physical therapy and she gets more strength and wanted to wean off of the ankle brace.  Vivi BarrackMatthew R Jayci Ellefson DPM

## 2018-04-08 DIAGNOSIS — F4310 Post-traumatic stress disorder, unspecified: Secondary | ICD-10-CM | POA: Diagnosis not present

## 2018-04-09 DIAGNOSIS — M25472 Effusion, left ankle: Secondary | ICD-10-CM | POA: Diagnosis not present

## 2018-04-09 DIAGNOSIS — M6281 Muscle weakness (generalized): Secondary | ICD-10-CM | POA: Diagnosis not present

## 2018-04-09 DIAGNOSIS — M25672 Stiffness of left ankle, not elsewhere classified: Secondary | ICD-10-CM | POA: Diagnosis not present

## 2018-04-09 DIAGNOSIS — R269 Unspecified abnormalities of gait and mobility: Secondary | ICD-10-CM | POA: Diagnosis not present

## 2018-04-14 DIAGNOSIS — R269 Unspecified abnormalities of gait and mobility: Secondary | ICD-10-CM | POA: Diagnosis not present

## 2018-04-14 DIAGNOSIS — M25472 Effusion, left ankle: Secondary | ICD-10-CM | POA: Diagnosis not present

## 2018-04-14 DIAGNOSIS — M6281 Muscle weakness (generalized): Secondary | ICD-10-CM | POA: Diagnosis not present

## 2018-04-14 DIAGNOSIS — M25672 Stiffness of left ankle, not elsewhere classified: Secondary | ICD-10-CM | POA: Diagnosis not present

## 2018-04-15 DIAGNOSIS — F4310 Post-traumatic stress disorder, unspecified: Secondary | ICD-10-CM | POA: Diagnosis not present

## 2018-04-16 DIAGNOSIS — M25472 Effusion, left ankle: Secondary | ICD-10-CM | POA: Diagnosis not present

## 2018-04-16 DIAGNOSIS — M6281 Muscle weakness (generalized): Secondary | ICD-10-CM | POA: Diagnosis not present

## 2018-04-16 DIAGNOSIS — R269 Unspecified abnormalities of gait and mobility: Secondary | ICD-10-CM | POA: Diagnosis not present

## 2018-04-16 DIAGNOSIS — M25672 Stiffness of left ankle, not elsewhere classified: Secondary | ICD-10-CM | POA: Diagnosis not present

## 2018-04-19 DIAGNOSIS — R7303 Prediabetes: Secondary | ICD-10-CM | POA: Diagnosis not present

## 2018-04-19 DIAGNOSIS — F4321 Adjustment disorder with depressed mood: Secondary | ICD-10-CM | POA: Diagnosis not present

## 2018-04-19 DIAGNOSIS — R7309 Other abnormal glucose: Secondary | ICD-10-CM | POA: Diagnosis not present

## 2018-04-19 DIAGNOSIS — I10 Essential (primary) hypertension: Secondary | ICD-10-CM | POA: Diagnosis not present

## 2018-04-19 DIAGNOSIS — G4700 Insomnia, unspecified: Secondary | ICD-10-CM | POA: Diagnosis not present

## 2018-04-21 DIAGNOSIS — M6281 Muscle weakness (generalized): Secondary | ICD-10-CM | POA: Diagnosis not present

## 2018-04-21 DIAGNOSIS — R269 Unspecified abnormalities of gait and mobility: Secondary | ICD-10-CM | POA: Diagnosis not present

## 2018-04-21 DIAGNOSIS — M25672 Stiffness of left ankle, not elsewhere classified: Secondary | ICD-10-CM | POA: Diagnosis not present

## 2018-04-21 DIAGNOSIS — M25472 Effusion, left ankle: Secondary | ICD-10-CM | POA: Diagnosis not present

## 2018-04-22 ENCOUNTER — Ambulatory Visit (INDEPENDENT_AMBULATORY_CARE_PROVIDER_SITE_OTHER): Payer: Federal, State, Local not specified - PPO | Admitting: Podiatry

## 2018-04-22 DIAGNOSIS — T148XXA Other injury of unspecified body region, initial encounter: Secondary | ICD-10-CM

## 2018-04-23 NOTE — Progress Notes (Signed)
Subjective: Rebecca Zuniga is a 52 y.o. is seen today in office s/p left posterior tibial tendon repair preformed on 01/15/2018.  She states that she has been doing well she is been wearing a tennis shoe with a ankle brace during the day and will go to physical therapy.  She is in a continue physical therapy to the end of the month.  Overall she states that she is doing better with occasional discomfort and swelling but overall she states her pain is much improved compared to what it was prior to surgery.  No recent injury or falls and she has no concerns. Denies any systemic complaints such as fevers, chills, nausea, vomiting. No calf pain, chest pain, shortness of breath.   Objective: General: No acute distress, AAOx3  DP/PT pulses palpable 2/4, CRT < 3 sec to all digits.  Protective sensation intact. Motor function intact.  LEFT ankle: Incision is well coapted without any evidence of dehiscence and a scar is formed.  There is no significant discomfort to the surgical site and there is trace edema there is no erythema or increase in warmth.  Range of motion appears to be intact.  MMT 5/5.  No other area tenderness identified.   No other open lesions or pre-ulcerative lesions.  No pain with calf compression, swelling, warmth, erythema.   Assessment and Plan:  Status post left ankle PT repair, doing well with no complications  -Treatment options discussed including all alternatives, risks, and complications -At this point she is doing well.  I want her to continue with physical therapy and rehab exercises.  As she increases her activity on her to wean off the brace that she is doing better.  She was wanting to do weightbearing exercises.  Alternate finished physical therapy before she starts going to gym on her own doing weightbearing exercises.  Continue ice and elevation as well as compression to help with swelling.  Vivi Barrack DPM

## 2018-05-01 ENCOUNTER — Telehealth: Payer: Self-pay | Admitting: Podiatry

## 2018-05-01 NOTE — Telephone Encounter (Signed)
Pt called saying she does not want her name mentioned or brought up with her boss who is here this afternoon for an appointment of his own. Stated she wants a call if he asks about her in any way.

## 2018-05-01 NOTE — Telephone Encounter (Signed)
This is Rebecca Zuniga. I need to speak to the nurse about my medical records. My number is [phone].

## 2018-05-01 NOTE — Telephone Encounter (Signed)
I need to speak to a nurse about my medical records. If you would, please call me back at [phone]. Thank you.

## 2018-05-01 NOTE — Telephone Encounter (Signed)
Attempted to call pt back. Left a voicemail for her to call me back directly at [phone] or call the main number of [phone].

## 2018-05-21 DIAGNOSIS — F4310 Post-traumatic stress disorder, unspecified: Secondary | ICD-10-CM | POA: Diagnosis not present

## 2018-05-28 DIAGNOSIS — F4310 Post-traumatic stress disorder, unspecified: Secondary | ICD-10-CM | POA: Diagnosis not present

## 2018-06-03 ENCOUNTER — Ambulatory Visit: Payer: Federal, State, Local not specified - PPO | Admitting: Podiatry

## 2018-06-03 DIAGNOSIS — M76821 Posterior tibial tendinitis, right leg: Secondary | ICD-10-CM | POA: Diagnosis not present

## 2018-06-03 DIAGNOSIS — M76822 Posterior tibial tendinitis, left leg: Secondary | ICD-10-CM

## 2018-06-03 DIAGNOSIS — T148XXA Other injury of unspecified body region, initial encounter: Secondary | ICD-10-CM

## 2018-06-03 NOTE — Progress Notes (Signed)
Subjective: Rebecca Zuniga is a 52 y.o. is seen today in office s/p left posterior tibial tendon repair preformed on 01/15/2018.  She states that overall she is doing well.  She underscore medications will get sharp pain at times but overall she is doing well she is having no significant discomfort she has not had any swelling.  She is an increase in her activity level.  She wears the ankle brace only at work and she feels that she uses this is almost a safety although she does not feel that she needs it.  She is asked to the orthotics today. Denies any systemic complaints such as fevers, chills, nausea, vomiting. No calf pain, chest pain, shortness of breath.   Objective: General: No acute distress, AAOx3  DP/PT pulses palpable 2/4, CRT < 3 sec to all digits.  Protective sensation intact. Motor function intact.  LEFT ankle: Incision is well coapted without any evidence of dehiscence and a scar is formed.  There is no significant discomfort to palpation of the area and there is no edema, erythema, increase in warmth.  Mild decrease in the arch upon weightbearing.  No other areas of tenderness identified today.  No other open lesions or pre-ulcerative lesions.  No pain with calf compression, swelling, warmth, erythema.   Assessment and Plan:  Status post left ankle PT repair, doing well with no complications  -Treatment options discussed including all alternatives, risks, and complications -At this point she is doing well.  I want her to gradually increase her activity level. Continue her ankle brace as needed however I want her to start to wean off of wearing this.  I do think she benefit from orthotics and she wants to proceed with this.  We did evaluate her today she was molded for inserts.  I will see her back in 3 weeks for the inserts.  Otherwise from a surgical standpoint she states that she is doing well she is happy with the outcome of the surgery and at this point we discharged from  postoperative course but encouraged to call any questions or concerns any changes symptoms.  Vivi BarrackMatthew R Lu Paradise DPM

## 2018-06-30 ENCOUNTER — Ambulatory Visit: Payer: Federal, State, Local not specified - PPO | Admitting: Orthotics

## 2018-06-30 DIAGNOSIS — M76822 Posterior tibial tendinitis, left leg: Secondary | ICD-10-CM

## 2018-07-01 NOTE — Progress Notes (Signed)
Patient came in today to pick up custom made foot orthotics.  The goals were accomplished and the patient reported no dissatisfaction with said orthotics.  Patient was advised of breakin period and how to report any issues. 

## 2018-07-16 DIAGNOSIS — M5032 Other cervical disc degeneration, mid-cervical region, unspecified level: Secondary | ICD-10-CM | POA: Diagnosis not present

## 2018-07-16 DIAGNOSIS — M9903 Segmental and somatic dysfunction of lumbar region: Secondary | ICD-10-CM | POA: Diagnosis not present

## 2018-07-16 DIAGNOSIS — M5417 Radiculopathy, lumbosacral region: Secondary | ICD-10-CM | POA: Diagnosis not present

## 2018-07-16 DIAGNOSIS — M9905 Segmental and somatic dysfunction of pelvic region: Secondary | ICD-10-CM | POA: Diagnosis not present

## 2018-07-18 DIAGNOSIS — M5032 Other cervical disc degeneration, mid-cervical region, unspecified level: Secondary | ICD-10-CM | POA: Diagnosis not present

## 2018-07-18 DIAGNOSIS — M9903 Segmental and somatic dysfunction of lumbar region: Secondary | ICD-10-CM | POA: Diagnosis not present

## 2018-07-18 DIAGNOSIS — M5417 Radiculopathy, lumbosacral region: Secondary | ICD-10-CM | POA: Diagnosis not present

## 2018-07-18 DIAGNOSIS — M9905 Segmental and somatic dysfunction of pelvic region: Secondary | ICD-10-CM | POA: Diagnosis not present

## 2018-08-23 DIAGNOSIS — R7303 Prediabetes: Secondary | ICD-10-CM | POA: Diagnosis not present

## 2018-08-23 DIAGNOSIS — R7309 Other abnormal glucose: Secondary | ICD-10-CM | POA: Diagnosis not present

## 2018-08-23 DIAGNOSIS — I10 Essential (primary) hypertension: Secondary | ICD-10-CM | POA: Diagnosis not present

## 2018-08-23 DIAGNOSIS — G4700 Insomnia, unspecified: Secondary | ICD-10-CM | POA: Diagnosis not present

## 2018-10-11 DIAGNOSIS — N958 Other specified menopausal and perimenopausal disorders: Secondary | ICD-10-CM | POA: Diagnosis not present

## 2018-10-11 DIAGNOSIS — Z6829 Body mass index (BMI) 29.0-29.9, adult: Secondary | ICD-10-CM | POA: Diagnosis not present

## 2018-10-11 DIAGNOSIS — I10 Essential (primary) hypertension: Secondary | ICD-10-CM | POA: Diagnosis not present

## 2018-10-11 DIAGNOSIS — R7309 Other abnormal glucose: Secondary | ICD-10-CM | POA: Diagnosis not present

## 2018-10-13 DIAGNOSIS — D469 Myelodysplastic syndrome, unspecified: Secondary | ICD-10-CM | POA: Diagnosis not present

## 2018-10-13 DIAGNOSIS — F4321 Adjustment disorder with depressed mood: Secondary | ICD-10-CM | POA: Diagnosis not present

## 2018-10-13 DIAGNOSIS — G4700 Insomnia, unspecified: Secondary | ICD-10-CM | POA: Diagnosis not present

## 2018-10-13 DIAGNOSIS — I10 Essential (primary) hypertension: Secondary | ICD-10-CM | POA: Diagnosis not present

## 2018-11-27 DIAGNOSIS — I10 Essential (primary) hypertension: Secondary | ICD-10-CM | POA: Diagnosis not present

## 2018-12-15 ENCOUNTER — Encounter: Payer: Self-pay | Admitting: Podiatry

## 2019-01-05 DIAGNOSIS — M9903 Segmental and somatic dysfunction of lumbar region: Secondary | ICD-10-CM | POA: Diagnosis not present

## 2019-01-05 DIAGNOSIS — M2560 Stiffness of unspecified joint, not elsewhere classified: Secondary | ICD-10-CM | POA: Diagnosis not present

## 2019-01-05 DIAGNOSIS — R293 Abnormal posture: Secondary | ICD-10-CM | POA: Diagnosis not present

## 2019-01-05 DIAGNOSIS — M545 Low back pain: Secondary | ICD-10-CM | POA: Diagnosis not present

## 2019-01-10 DIAGNOSIS — I10 Essential (primary) hypertension: Secondary | ICD-10-CM | POA: Diagnosis not present

## 2019-01-10 DIAGNOSIS — Z6830 Body mass index (BMI) 30.0-30.9, adult: Secondary | ICD-10-CM | POA: Diagnosis not present

## 2019-01-24 DIAGNOSIS — M9903 Segmental and somatic dysfunction of lumbar region: Secondary | ICD-10-CM | POA: Diagnosis not present

## 2019-01-24 DIAGNOSIS — M545 Low back pain: Secondary | ICD-10-CM | POA: Diagnosis not present

## 2019-01-24 DIAGNOSIS — R293 Abnormal posture: Secondary | ICD-10-CM | POA: Diagnosis not present

## 2019-01-24 DIAGNOSIS — M2560 Stiffness of unspecified joint, not elsewhere classified: Secondary | ICD-10-CM | POA: Diagnosis not present

## 2019-03-14 DIAGNOSIS — Z7189 Other specified counseling: Secondary | ICD-10-CM | POA: Diagnosis not present

## 2019-03-14 DIAGNOSIS — I10 Essential (primary) hypertension: Secondary | ICD-10-CM | POA: Diagnosis not present

## 2019-04-20 IMAGING — MR MR ANKLE*L* W/O CM
4 of 5 series · 28 of 40 positions shown · non-contrast
Comparison: None.

CLINICAL DATA: Chronic left medial ankle pain for 5 years.

EXAM:
MRI OF THE LEFT ANKLE WITHOUT CONTRAST
TECHNIQUE: Multiplanar, multisequence MR imaging of the ankle was performed. No
intravenous contrast was administered.

[Series 6: T2 fat-sat · coronal · 4.0mm · 0.33mm/px · 8 of 25 slices shown (1 of 3)]
[im 1/25]
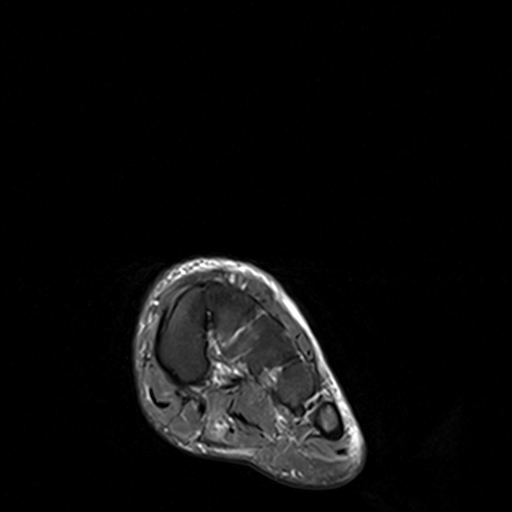
[im 4/25]
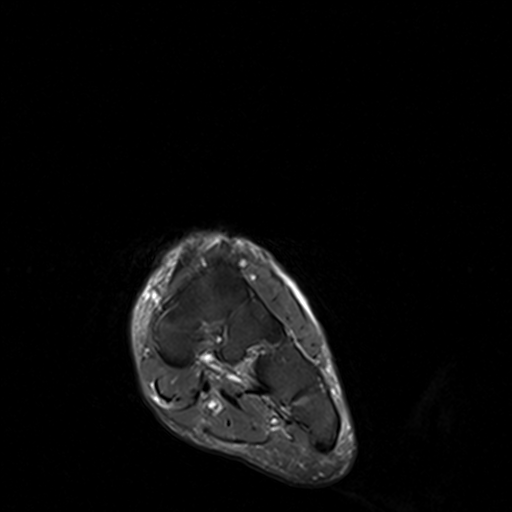
[im 7/25]
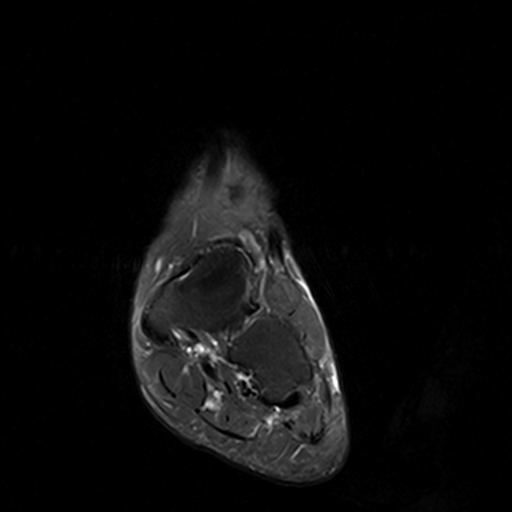
[im 11/25]
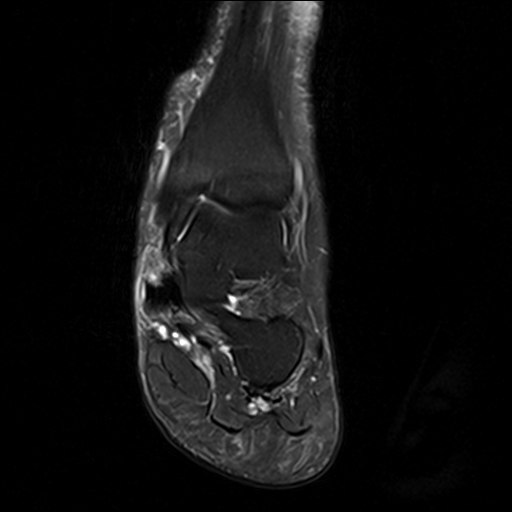
[im 14/25]
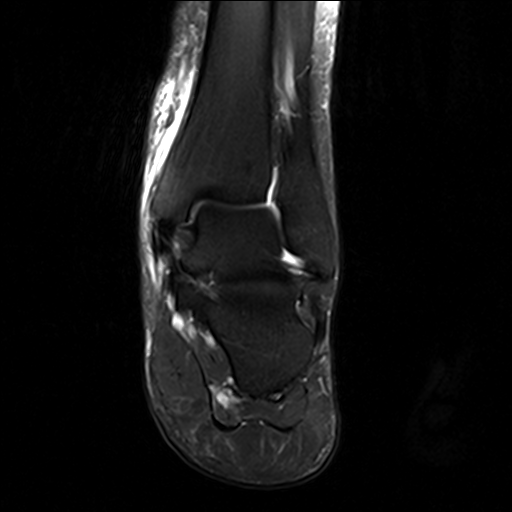
[im 18/25]
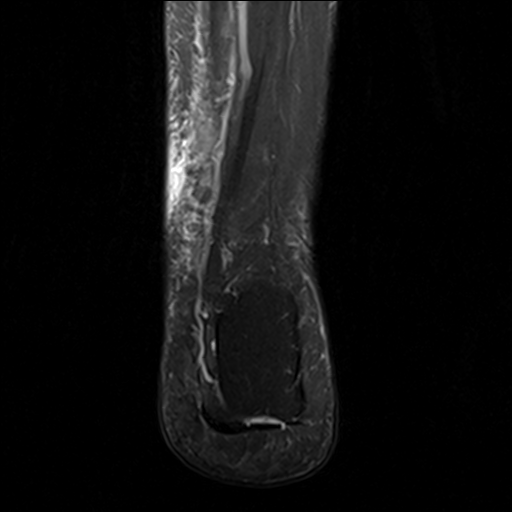
[im 21/25]
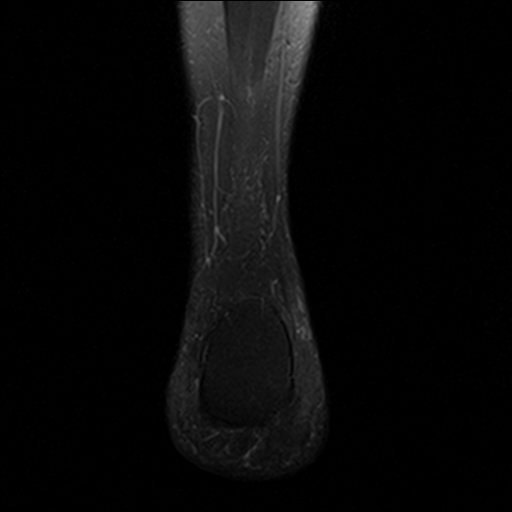
[im 25/25]
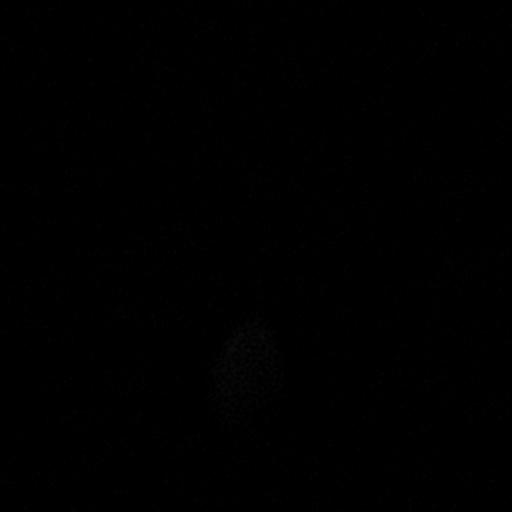

[Series 7: T2 fat-sat · sagittal · 3.0mm · 0.33mm/px · 7 of 22 slices shown (2 of 3)]
[im 1/22]
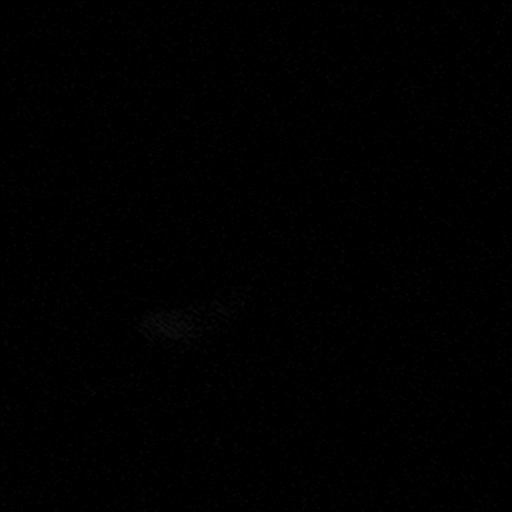
[im 4/22]
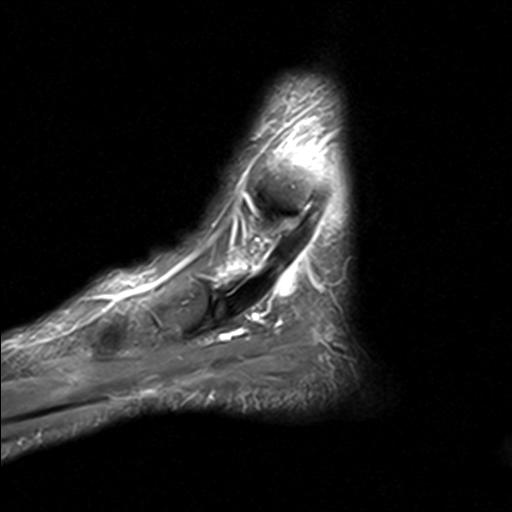
[im 8/22]
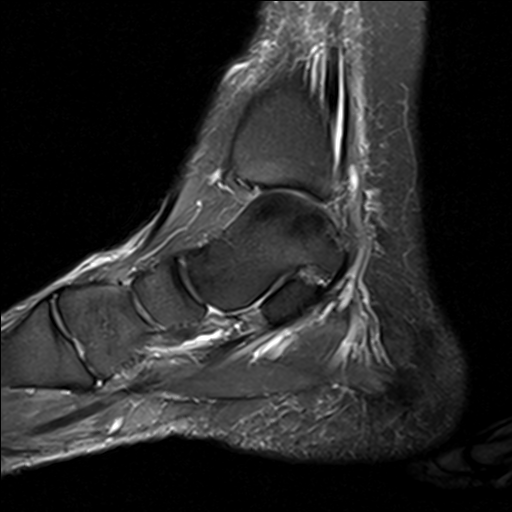
[im 11/22]
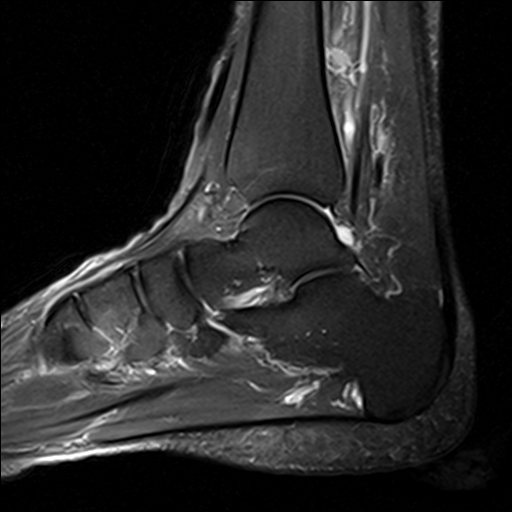
[im 15/22]
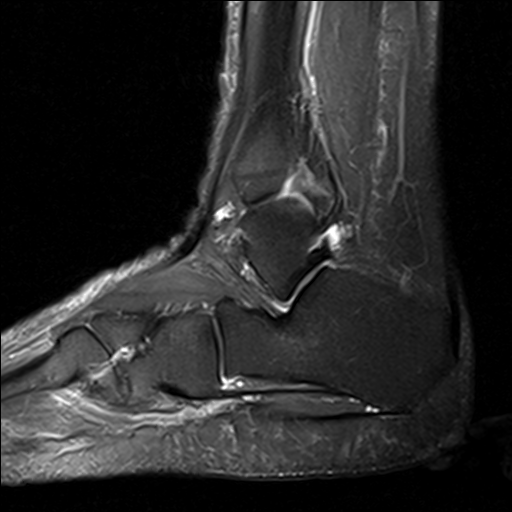
[im 18/22]
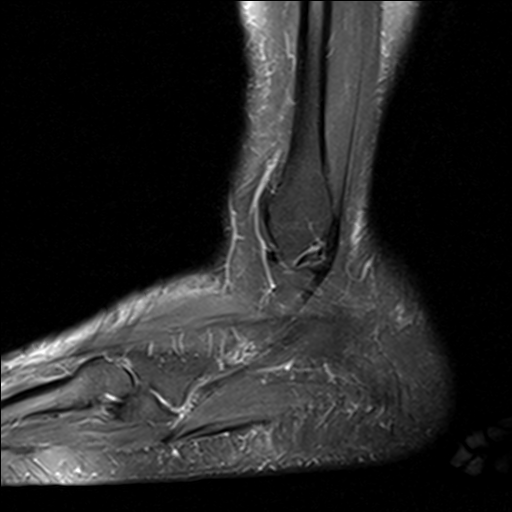
[im 22/22]
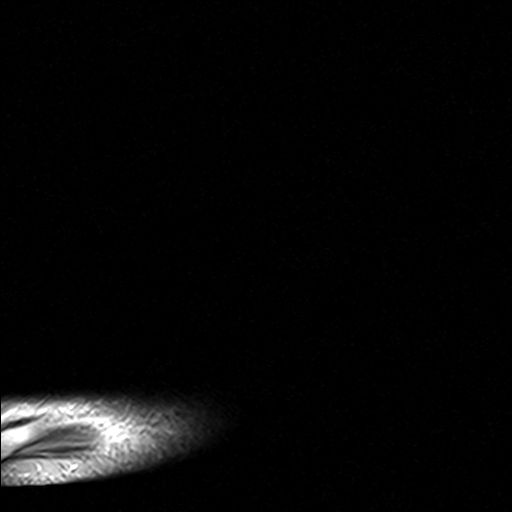

[Series 8: T2 fat-sat · axial · 4.0mm · 0.53mm/px · z∈[-16,+94]mm · 4 of 27 slices shown (3 of 3)]
[im 1/27]
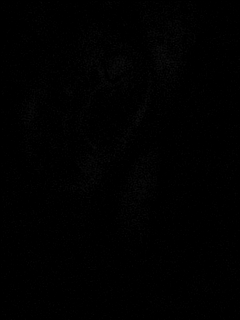
[im 4/27]
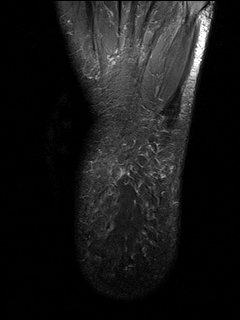
[im 14/27]
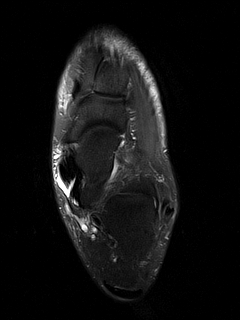
[im 23/27]
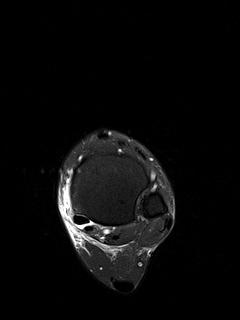

[Series 9: PD fat-sat · axial · 4.0mm · 0.53mm/px · z∈[-16,+114]mm · 9 of 27 slices shown]
[im 1/27]
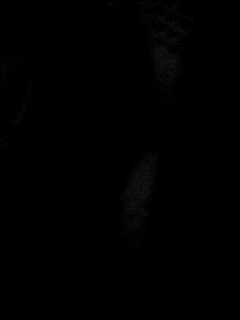
[im 4/27]
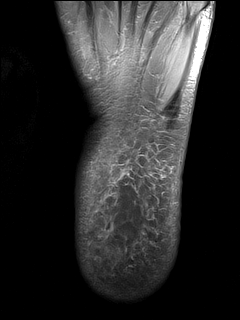
[im 7/27]
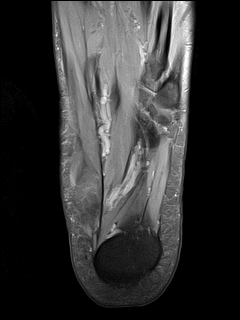
[im 10/27]
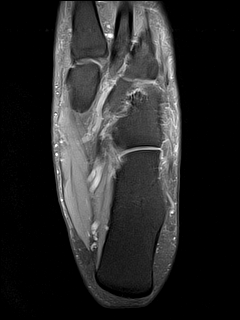
[im 14/27]
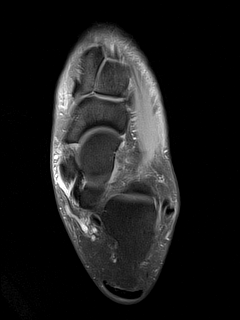
[im 17/27]
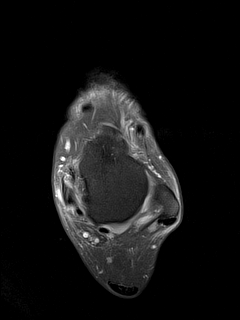
[im 20/27]
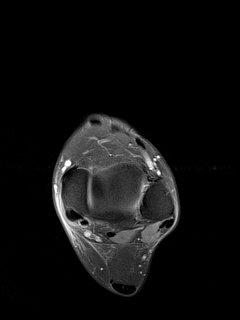
[im 23/27]
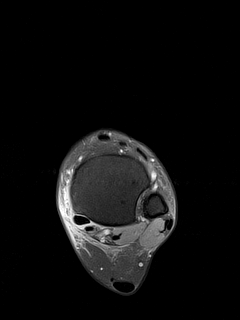
[im 27/27]
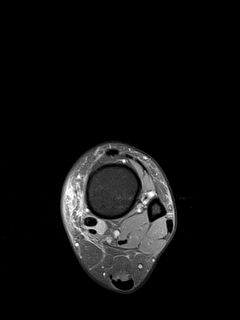

[28 of 40 positions shown; findings below may reference images not displayed]

FINDINGS: TENDONS

Peroneal: Peroneal longus tendon intact. Peroneal brevis intact.

Posteromedial: Moderate tendinosis of the posterior tibial tendon
with a short-segment longitudinal split tear and moderate
tenosynovitis. Flexor hallucis longus tendon intact. Flexor
digitorum longus tendon intact.

Anterior: Tibialis anterior tendon intact. Extensor hallucis longus
tendon intact Extensor digitorum longus tendon intact.

Achilles:  Intact.

Plantar Fascia: Intact.

LIGAMENTS

Lateral: Anterior talofibular ligament intact. Calcaneofibular
ligament intact. Posterior talofibular ligament intact. Anterior and
posterior tibiofibular ligaments intact.

Medial: Deltoid ligament intact. Spring ligament intact.

Lisfranc: Intact.

Ankle Joint: No joint effusion. Normal ankle mortise. No chondral
defect.

Subtalar Joints/Sinus Tarsi: Normal subtalar joints. No subtalar
joint effusion. Normal sinus tarsi.

Bones: No marrow signal abnormality.  No fracture or dislocation.

Soft Tissue: Muscles are normal. No muscle atrophy. No fluid
collection or hematoma.
IMPRESSION: 1. Moderate tendinosis of the posterior tibial tendon with a
short-segment longitudinal split tear and moderate tenosynovitis.

## 2019-10-22 DIAGNOSIS — Z01419 Encounter for gynecological examination (general) (routine) without abnormal findings: Secondary | ICD-10-CM | POA: Diagnosis not present

## 2019-10-22 DIAGNOSIS — N951 Menopausal and female climacteric states: Secondary | ICD-10-CM | POA: Diagnosis not present

## 2019-10-22 DIAGNOSIS — Z1231 Encounter for screening mammogram for malignant neoplasm of breast: Secondary | ICD-10-CM | POA: Diagnosis not present

## 2019-10-22 DIAGNOSIS — N852 Hypertrophy of uterus: Secondary | ICD-10-CM | POA: Diagnosis not present

## 2019-10-22 DIAGNOSIS — N898 Other specified noninflammatory disorders of vagina: Secondary | ICD-10-CM | POA: Diagnosis not present

## 2019-10-22 DIAGNOSIS — Z113 Encounter for screening for infections with a predominantly sexual mode of transmission: Secondary | ICD-10-CM | POA: Diagnosis not present

## 2019-10-23 DIAGNOSIS — N951 Menopausal and female climacteric states: Secondary | ICD-10-CM | POA: Diagnosis not present

## 2019-11-04 DIAGNOSIS — R399 Unspecified symptoms and signs involving the genitourinary system: Secondary | ICD-10-CM | POA: Diagnosis not present

## 2019-11-04 DIAGNOSIS — Z1272 Encounter for screening for malignant neoplasm of vagina: Secondary | ICD-10-CM | POA: Diagnosis not present

## 2019-11-04 DIAGNOSIS — Z780 Asymptomatic menopausal state: Secondary | ICD-10-CM | POA: Diagnosis not present

## 2019-11-04 DIAGNOSIS — Z1151 Encounter for screening for human papillomavirus (HPV): Secondary | ICD-10-CM | POA: Diagnosis not present

## 2019-12-28 DIAGNOSIS — B372 Candidiasis of skin and nail: Secondary | ICD-10-CM | POA: Diagnosis not present

## 2020-01-30 DIAGNOSIS — D649 Anemia, unspecified: Secondary | ICD-10-CM | POA: Diagnosis not present

## 2020-01-30 DIAGNOSIS — R7303 Prediabetes: Secondary | ICD-10-CM | POA: Diagnosis not present

## 2020-01-30 DIAGNOSIS — R799 Abnormal finding of blood chemistry, unspecified: Secondary | ICD-10-CM | POA: Diagnosis not present

## 2020-01-30 DIAGNOSIS — R7309 Other abnormal glucose: Secondary | ICD-10-CM | POA: Diagnosis not present

## 2020-01-30 DIAGNOSIS — I10 Essential (primary) hypertension: Secondary | ICD-10-CM | POA: Diagnosis not present

## 2020-02-12 DIAGNOSIS — N939 Abnormal uterine and vaginal bleeding, unspecified: Secondary | ICD-10-CM | POA: Diagnosis not present

## 2020-02-12 DIAGNOSIS — N920 Excessive and frequent menstruation with regular cycle: Secondary | ICD-10-CM | POA: Diagnosis not present

## 2020-02-12 DIAGNOSIS — N898 Other specified noninflammatory disorders of vagina: Secondary | ICD-10-CM | POA: Diagnosis not present

## 2020-02-12 DIAGNOSIS — D259 Leiomyoma of uterus, unspecified: Secondary | ICD-10-CM | POA: Diagnosis not present

## 2020-02-20 DIAGNOSIS — I10 Essential (primary) hypertension: Secondary | ICD-10-CM | POA: Diagnosis not present

## 2020-02-20 DIAGNOSIS — E1100 Type 2 diabetes mellitus with hyperosmolarity without nonketotic hyperglycemic-hyperosmolar coma (NKHHC): Secondary | ICD-10-CM | POA: Diagnosis not present

## 2020-05-28 DIAGNOSIS — R7303 Prediabetes: Secondary | ICD-10-CM | POA: Diagnosis not present

## 2020-05-28 DIAGNOSIS — D259 Leiomyoma of uterus, unspecified: Secondary | ICD-10-CM | POA: Diagnosis not present

## 2020-05-28 DIAGNOSIS — I10 Essential (primary) hypertension: Secondary | ICD-10-CM | POA: Diagnosis not present

## 2020-05-28 DIAGNOSIS — E782 Mixed hyperlipidemia: Secondary | ICD-10-CM | POA: Diagnosis not present
